# Patient Record
Sex: Female | Born: 1945 | Hispanic: Yes | Marital: Single | Smoking: Never smoker
Health system: Southern US, Community
[De-identification: ages and names within clinical notes are randomized; demographics above are authoritative.]

## PROBLEM LIST (undated history)

## (undated) DIAGNOSIS — I10 Essential (primary) hypertension: Secondary | ICD-10-CM

## (undated) DIAGNOSIS — E079 Disorder of thyroid, unspecified: Secondary | ICD-10-CM

## (undated) DIAGNOSIS — E119 Type 2 diabetes mellitus without complications: Secondary | ICD-10-CM

## (undated) DIAGNOSIS — E785 Hyperlipidemia, unspecified: Secondary | ICD-10-CM

## (undated) HISTORY — DX: Type 2 diabetes mellitus without complications: E11.9

## (undated) HISTORY — DX: Essential (primary) hypertension: I10

## (undated) HISTORY — DX: Hyperlipidemia, unspecified: E78.5

## (undated) HISTORY — DX: Disorder of thyroid, unspecified: E07.9

---

## 2004-04-29 ENCOUNTER — Ambulatory Visit: Payer: Self-pay

## 2005-04-28 ENCOUNTER — Ambulatory Visit: Payer: Self-pay

## 2005-10-20 ENCOUNTER — Emergency Department: Payer: Self-pay | Admitting: Emergency Medicine

## 2006-08-10 ENCOUNTER — Ambulatory Visit: Payer: Self-pay

## 2007-08-22 ENCOUNTER — Ambulatory Visit: Payer: Self-pay

## 2008-11-19 ENCOUNTER — Ambulatory Visit: Payer: Self-pay

## 2009-12-24 ENCOUNTER — Ambulatory Visit: Payer: Self-pay | Admitting: Family

## 2013-01-02 ENCOUNTER — Ambulatory Visit: Payer: Self-pay | Admitting: Family Medicine

## 2013-01-11 ENCOUNTER — Ambulatory Visit: Payer: Self-pay | Admitting: Family Medicine

## 2013-01-18 ENCOUNTER — Encounter: Payer: Self-pay | Admitting: *Deleted

## 2013-01-25 ENCOUNTER — Ambulatory Visit (INDEPENDENT_AMBULATORY_CARE_PROVIDER_SITE_OTHER): Payer: PRIVATE HEALTH INSURANCE | Admitting: General Surgery

## 2013-01-25 ENCOUNTER — Encounter: Payer: Self-pay | Admitting: General Surgery

## 2013-01-25 VITALS — BP 120/80 | HR 78 | Resp 12 | Ht 66.0 in | Wt 156.0 lb

## 2013-01-25 DIAGNOSIS — N6310 Unspecified lump in the right breast, unspecified quadrant: Secondary | ICD-10-CM

## 2013-01-25 DIAGNOSIS — N63 Unspecified lump in unspecified breast: Secondary | ICD-10-CM

## 2013-01-25 NOTE — Patient Instructions (Addendum)
Patient to return in four months. 

## 2013-01-25 NOTE — Progress Notes (Signed)
Patient ID: Mackenzie Mcfarland, female   DOB: Jun 12, 1946, 67 y.o.   MRN: [id number]  Chief Complaint  Patient presents with  . Other    mammogram    HPI Mackenzie Mcfarland is a 67 y.o. female. who presents for a breast evaluation. The most recent mammogram and ultrasound was done on 01/11/13 cat 4. Patient does perform regular self breast checks and gets regular mammograms done.    HPI  Past Medical History  Diagnosis Date  . Hyperlipidemia   . Hypertension   . Thyroid disease   . Diabetes mellitus without complication     History reviewed. No pertinent past surgical history.  History reviewed. No pertinent family history.  Social History History  Substance Use Topics  . Smoking status: Never Smoker   . Smokeless tobacco: Never Used  . Alcohol Use: No    No Known Allergies  Current Outpatient Prescriptions  Medication Sig Dispense Refill  . amLODipine (NORVASC) 10 MG tablet Take 10 mg by mouth daily.      Marland Kitchen lovastatin (MEVACOR) 40 MG tablet Take 40 mg by mouth at bedtime.      . metFORMIN (GLUCOPHAGE) 500 MG tablet Take 500 mg by mouth 2 (two) times daily with a meal.       No current facility-administered medications for this visit.    Review of Systems Review of Systems  Constitutional: Negative.   Respiratory: Negative.   Cardiovascular: Negative.     Blood pressure 120/80, pulse 78, resp. rate 12, height 5\' 6"  (1.676 m), weight 156 lb (70.761 kg).  Physical Exam Physical Exam  Constitutional: She is oriented to person, place, and time. She appears well-developed and well-nourished.  Eyes: Conjunctivae are normal. No scleral icterus.  Neck: Neck supple.  Cardiovascular: Normal rate, regular rhythm and normal heart sounds.   Pulmonary/Chest: Breath sounds normal. Right breast exhibits no inverted nipple, no mass, no nipple discharge, no skin change and no tenderness. Left breast exhibits no inverted nipple, no mass, no nipple  discharge, no skin change and no tenderness.  Abdominal: Soft. Bowel sounds are normal.  Lymphadenopathy:    She has no cervical adenopathy.    She has no axillary adenopathy.  Neurological: She is alert and oriented to person, place, and time.  Skin: Skin is warm and dry.    Data Reviewed Mammogram and ultrasound reviewed.  Assessment    Mammogram showed a small density medial right breast-not seen on MLO/ML views. US showed a 3 mm rounded benign appearing mass at 3 o'cl-this is smaller than seen on mammogram. Current findings are  Of very low suspicion and not well localised on imaging. Feel it is ok to do short term f/u.    Plan    Above discussed with pt and she is agreeable. Interpreter present for interview, exam and discussion.    Patient to return in four months for right breast diagnotic mammogram.   Gerlene Burdock G 01/25/2013, 9:58 AM

## 2013-06-13 ENCOUNTER — Ambulatory Visit: Payer: Self-pay | Admitting: General Surgery

## 2013-06-18 ENCOUNTER — Encounter: Payer: Self-pay | Admitting: General Surgery

## 2013-07-03 ENCOUNTER — Ambulatory Visit (INDEPENDENT_AMBULATORY_CARE_PROVIDER_SITE_OTHER): Payer: PRIVATE HEALTH INSURANCE | Admitting: General Surgery

## 2013-07-03 ENCOUNTER — Encounter: Payer: Self-pay | Admitting: General Surgery

## 2013-07-03 VITALS — BP 130/76 | HR 76 | Resp 12 | Ht 63.0 in | Wt 160.0 lb

## 2013-07-03 DIAGNOSIS — R928 Other abnormal and inconclusive findings on diagnostic imaging of breast: Secondary | ICD-10-CM

## 2013-07-03 NOTE — Patient Instructions (Signed)
Pt advised fully via interpreter.

## 2013-07-03 NOTE — Progress Notes (Signed)
Patient ID: Mackenzie Mcfarland, female   DOB: 03/02/1946, 68 y.o.   MRN: [id number]  Chief Complaint  Patient presents with  . Follow-up    right mammogram    HPI Mackenzie Mcfarland is a 68 y.o. female.  who presents for her breast evaluation. The most recent rigth mammogram and ultrasound was done on 06-13-13.  Patient does perform regular self breast checks and gets regular mammograms done.  No new breast issues. Interpreter here for assistance.  HPI  Past Medical History  Diagnosis Date  . Hyperlipidemia   . Hypertension   . Thyroid disease   . Diabetes mellitus without complication     History reviewed. No pertinent past surgical history.  History reviewed. No pertinent family history.  Social History History  Substance Use Topics  . Smoking status: Never Smoker   . Smokeless tobacco: Never Used  . Alcohol Use: No    No Known Allergies  Current Outpatient Prescriptions  Medication Sig Dispense Refill  . amLODipine (NORVASC) 10 MG tablet Take 10 mg by mouth daily.      Marland Kitchen. lovastatin (MEVACOR) 40 MG tablet Take 40 mg by mouth at bedtime.       No current facility-administered medications for this visit.    Review of Systems Review of Systems  Constitutional: Negative.   Respiratory: Negative.   Cardiovascular: Negative.     Blood pressure 130/76, pulse 76, resp. rate 12, height 5\' 3"  (1.6 m), weight 160 lb (72.576 kg).  Physical Exam Physical Exam  Constitutional: She is oriented to person, place, and time. She appears well-developed and well-nourished.  Neck: Neck supple.  Pulmonary/Chest: Right breast exhibits no inverted nipple, no mass, no nipple discharge, no skin change and no tenderness. Left breast exhibits no inverted nipple, no mass, no nipple discharge, no skin change and no tenderness.  Lymphadenopathy:    She has no cervical adenopathy.    She has no axillary adenopathy.  Neurological: She is alert and oriented to  person, place, and time.  Skin: Skin is warm and dry.    Data Reviewed Mammogram and ultrasound reviewed.  Assessment    No findings on exam or imaging.     Plan    Return to routine yearly screening-via her PCP or [facility]        Dimitrious Micciche G 07/03/2013, 8:58 PM

## 2014-04-15 ENCOUNTER — Encounter: Payer: Self-pay | Admitting: General Surgery

## 2014-06-20 ENCOUNTER — Ambulatory Visit: Payer: Self-pay | Admitting: Family Medicine

## 2014-12-14 IMAGING — MG MAM KOM DGT SCR W/CAD NO ORDER
1 series · 6 of 6 positions shown · non-contrast
Comparison: 12/24/2009, 11/19/2008, 08/22/2007.

REASON FOR EXAM: WIENER SCR MAMMO NO ORDER
COMMENTS:

PROCEDURE:     MAM - MAM MUBEEN DGT SCR W/CAD NO ORDER  - January 02, 2013  [DATE]
RESULT:

[R CC · right · 6 of 6 slices shown]
[im 1/6]
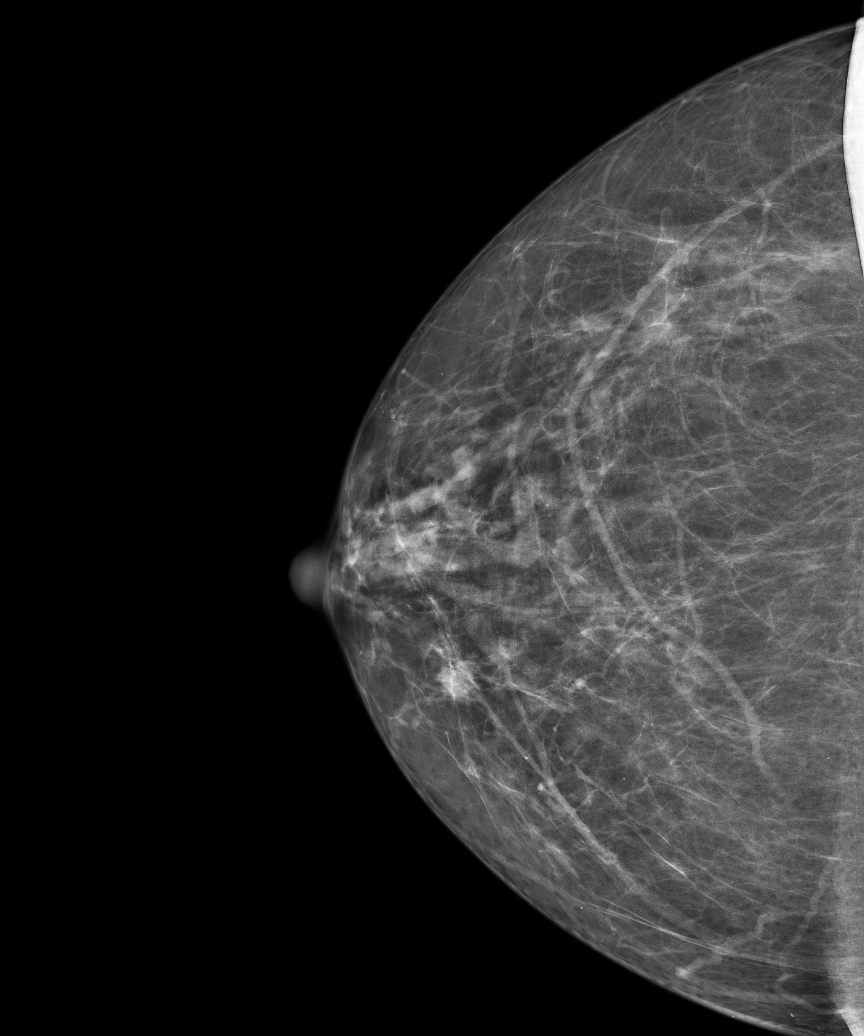
[im 2/6]
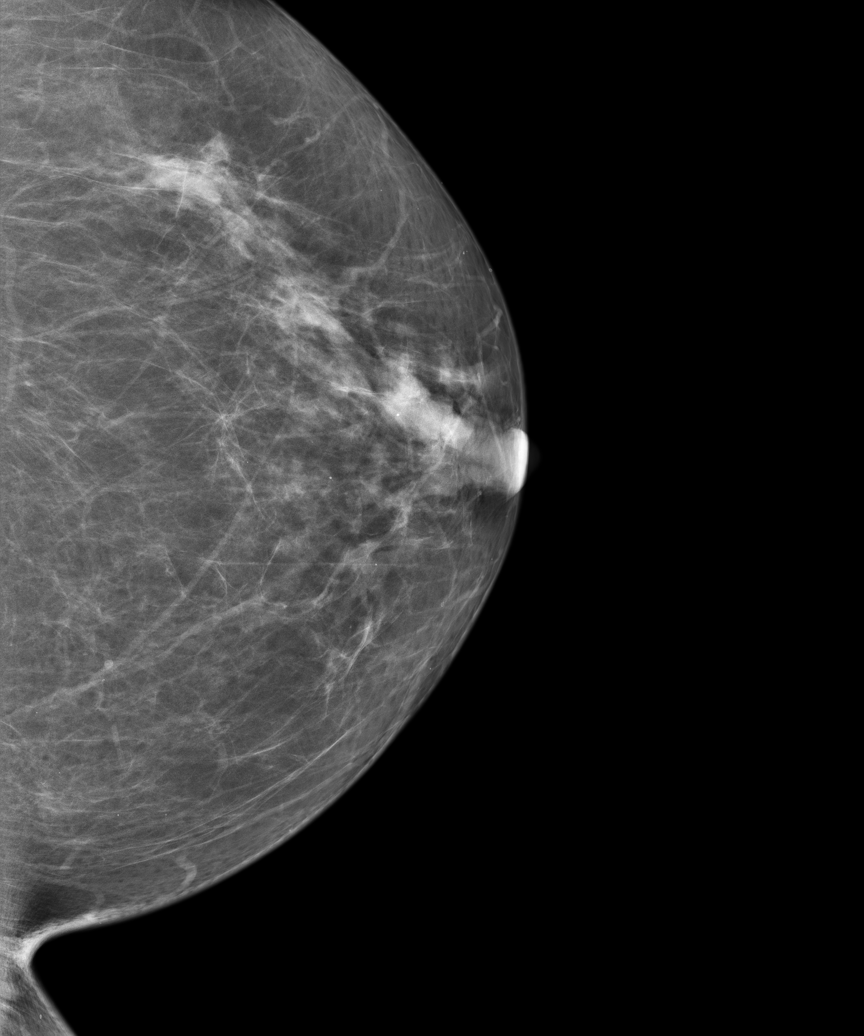
[im 3/6]
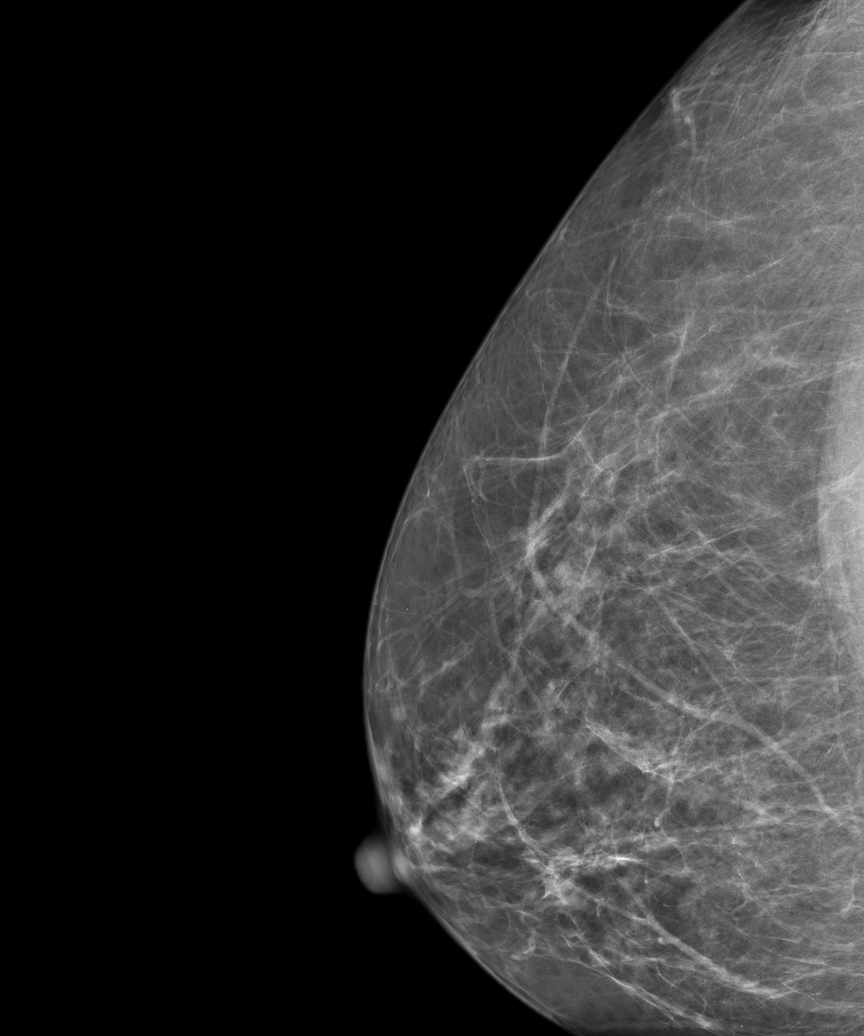
[im 4/6]
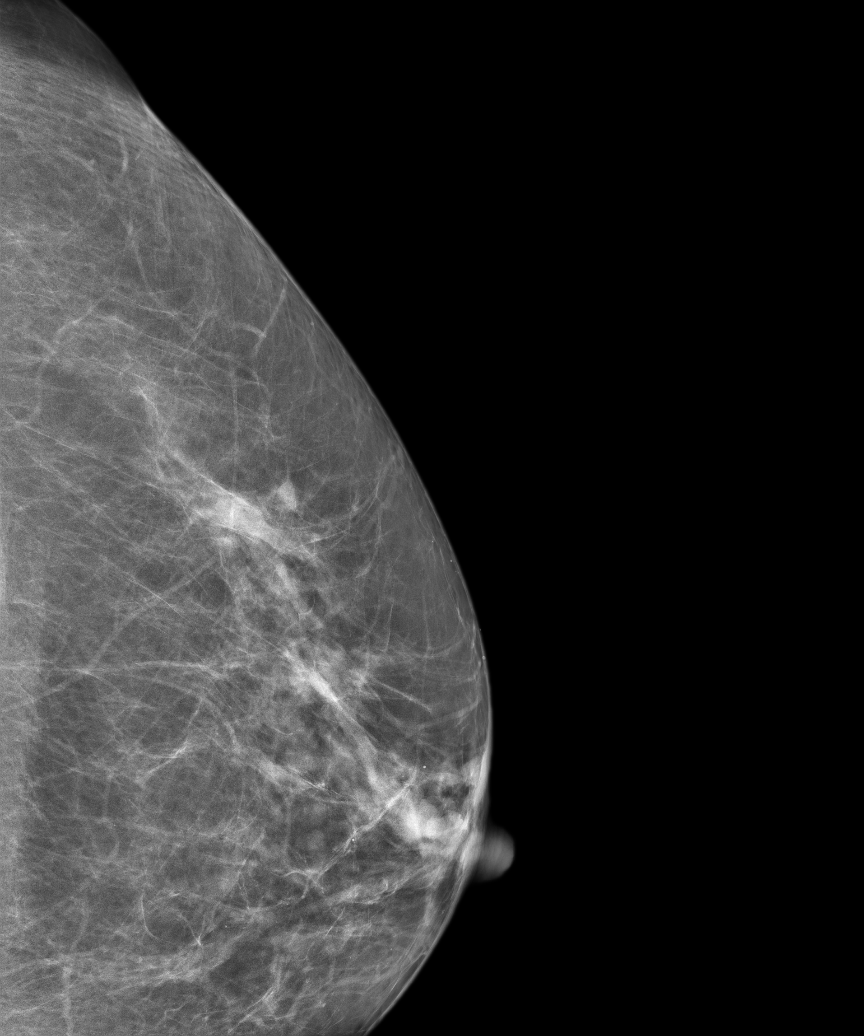
[im 5/6]
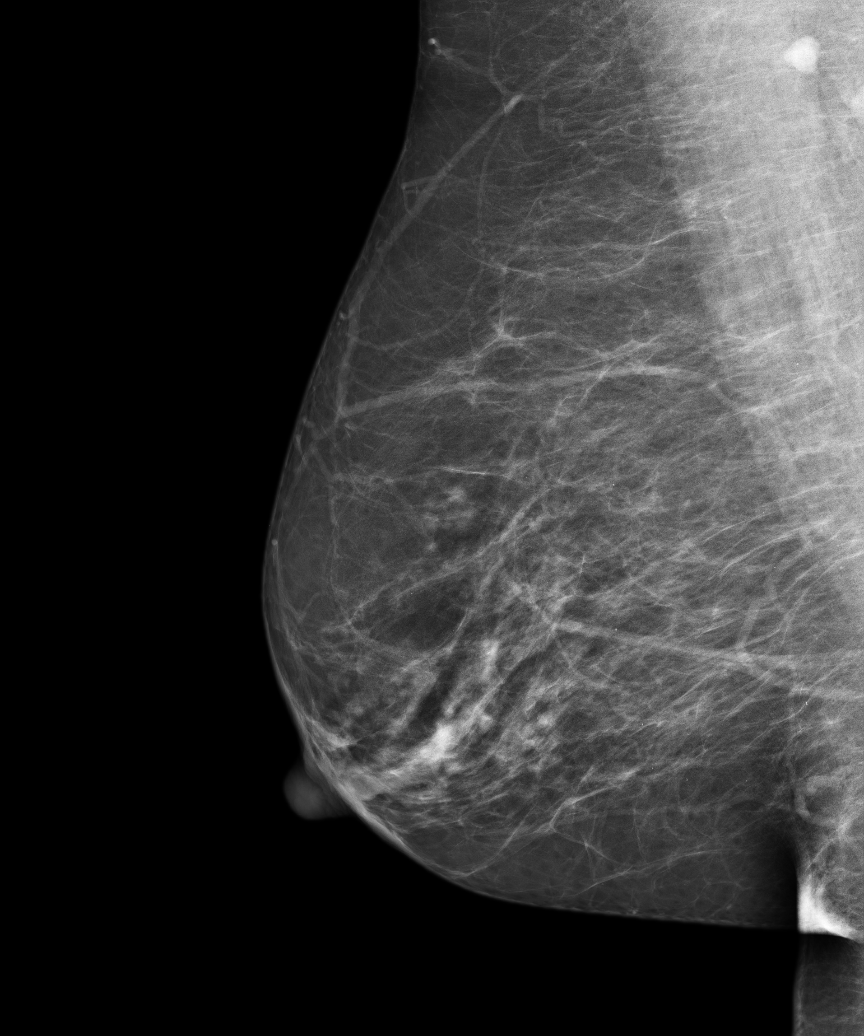
[im 6/6]
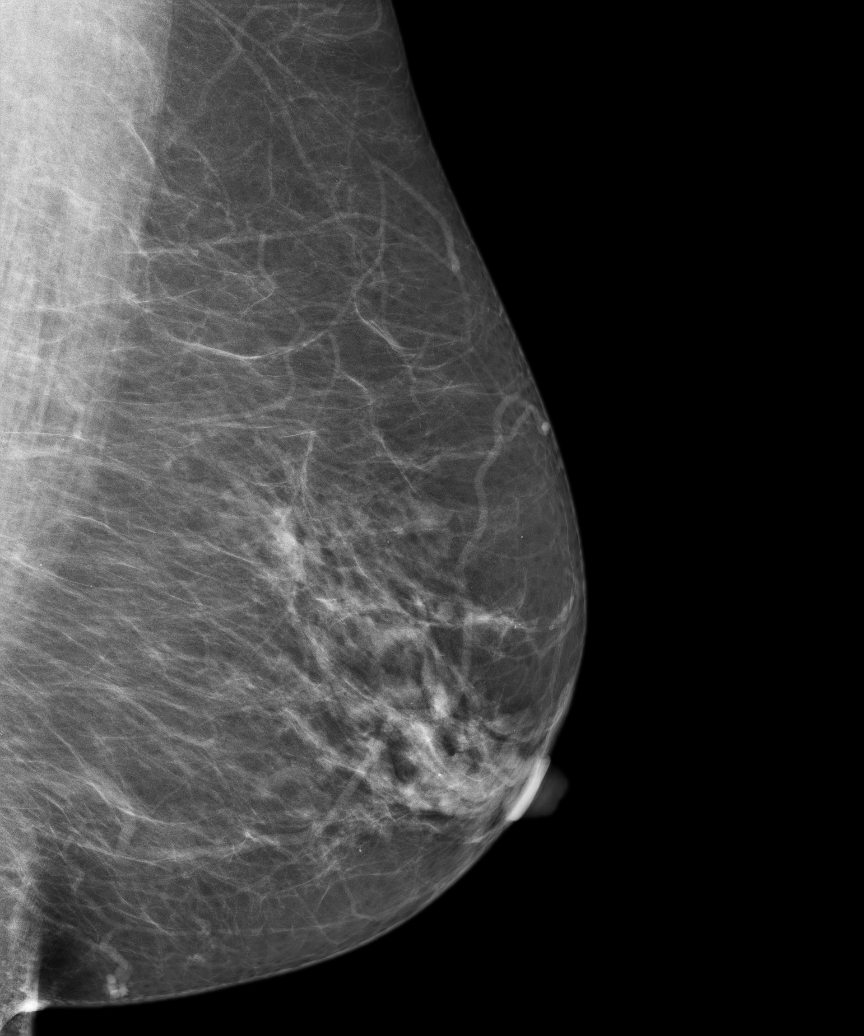

[6 of 6 positions shown; findings below may reference images not displayed]

FINDINGS: There is scattered fibroglandular tissue. There is a small, round asymmetry
in the medial aspect of the right CC view anterior depth. A definite
correlate is not seen on the MLO view. No suspicious masses or
calcifications are identified in the left breast.
IMPRESSION: 1.     BI-RADS: Category 0 - Needs Additional Imaging Evaluation.
2.     Recommend true lateral and spot compression magnification views of
the small asymmetry in the right CC view.

BREAST COMPOSITION: The breast composition is SCATTERED FIBROGLANDULAR
TISSUE (glandular tissue is 25-50%).

Thank you for this opportunity to contribute to the care of your patient.

A NEGATIVE MAMMOGRAM REPORT DOES NOT PRECLUDE BIOPSY OR OTHER EVALUATION OF
A CLINICALLY PALPABLE OR OTHERWISE SUSPICIOUS MASS OR LESION. BREAST CANCER
MAY NOT BE DETECTED BY MAMMOGRAPHY IN UP TO 10% OF CASES.

## 2017-02-08 ENCOUNTER — Other Ambulatory Visit: Payer: Self-pay | Admitting: Family Medicine

## 2017-02-08 DIAGNOSIS — Z1231 Encounter for screening mammogram for malignant neoplasm of breast: Secondary | ICD-10-CM

## 2017-03-01 ENCOUNTER — Ambulatory Visit
Admission: RE | Admit: 2017-03-01 | Discharge: 2017-03-01 | Disposition: A | Discharge status: Home / Self Care | Payer: Self-pay | Source: Ambulatory Visit | Attending: Family Medicine | Admitting: Family Medicine

## 2017-03-01 DIAGNOSIS — Z1231 Encounter for screening mammogram for malignant neoplasm of breast: Secondary | ICD-10-CM

## 2018-03-10 ENCOUNTER — Other Ambulatory Visit: Payer: Self-pay | Admitting: Family Medicine

## 2018-03-10 DIAGNOSIS — Z1231 Encounter for screening mammogram for malignant neoplasm of breast: Secondary | ICD-10-CM

## 2018-03-28 ENCOUNTER — Ambulatory Visit
Admission: RE | Admit: 2018-03-28 | Discharge: 2018-03-28 | Disposition: A | Discharge status: Home / Self Care | Payer: Self-pay | Source: Ambulatory Visit | Attending: Family Medicine | Admitting: Family Medicine

## 2018-03-28 DIAGNOSIS — Z1231 Encounter for screening mammogram for malignant neoplasm of breast: Secondary | ICD-10-CM | POA: Insufficient documentation

## 2020-10-03 ENCOUNTER — Other Ambulatory Visit: Payer: Self-pay | Admitting: Family Medicine

## 2020-10-03 DIAGNOSIS — Z1231 Encounter for screening mammogram for malignant neoplasm of breast: Secondary | ICD-10-CM

## 2020-10-16 ENCOUNTER — Other Ambulatory Visit: Payer: Self-pay

## 2020-10-16 ENCOUNTER — Ambulatory Visit
Admission: RE | Admit: 2020-10-16 | Discharge: 2020-10-16 | Disposition: A | Discharge status: Home / Self Care | Payer: Self-pay | Source: Ambulatory Visit | Attending: Family Medicine | Admitting: Family Medicine

## 2020-10-16 DIAGNOSIS — Z1231 Encounter for screening mammogram for malignant neoplasm of breast: Secondary | ICD-10-CM | POA: Insufficient documentation

## 2021-12-07 ENCOUNTER — Other Ambulatory Visit: Payer: Self-pay | Admitting: Family Medicine

## 2024-05-01 ENCOUNTER — Telehealth: Payer: Self-pay
